# Patient Record
Sex: Female | Born: 1955 | Race: Black or African American | Hispanic: No | Marital: Married | Smoking: Never smoker
Health system: Southern US, Community
[De-identification: ages and names within clinical notes are randomized; demographics above are authoritative.]

## PROBLEM LIST (undated history)

## (undated) DIAGNOSIS — D497 Neoplasm of unspecified behavior of endocrine glands and other parts of nervous system: Secondary | ICD-10-CM

## (undated) DIAGNOSIS — E119 Type 2 diabetes mellitus without complications: Secondary | ICD-10-CM

## (undated) DIAGNOSIS — I10 Essential (primary) hypertension: Secondary | ICD-10-CM

## (undated) DIAGNOSIS — J45909 Unspecified asthma, uncomplicated: Secondary | ICD-10-CM

## (undated) DIAGNOSIS — E785 Hyperlipidemia, unspecified: Secondary | ICD-10-CM

## (undated) HISTORY — DX: Neoplasm of unspecified behavior of endocrine glands and other parts of nervous system: D49.7

## (undated) HISTORY — DX: Essential (primary) hypertension: I10

## (undated) HISTORY — DX: Hyperlipidemia, unspecified: E78.5

## (undated) HISTORY — DX: Type 2 diabetes mellitus without complications: E11.9

## (undated) HISTORY — DX: Unspecified asthma, uncomplicated: J45.909

---

## 2007-06-13 ENCOUNTER — Emergency Department (HOSPITAL_COMMUNITY): Admission: EM | Admit: 2007-06-13 | Discharge: 2007-06-13 | Payer: Self-pay | Admitting: Emergency Medicine

## 2010-03-06 ENCOUNTER — Encounter: Payer: Self-pay | Admitting: Internal Medicine

## 2012-11-27 ENCOUNTER — Encounter: Payer: Self-pay | Admitting: Podiatry

## 2012-11-27 ENCOUNTER — Ambulatory Visit (INDEPENDENT_AMBULATORY_CARE_PROVIDER_SITE_OTHER): Payer: BC Managed Care – PPO | Admitting: Podiatry

## 2012-11-27 VITALS — BP 141/89 | HR 94 | Resp 12 | Ht 66.0 in | Wt 175.0 lb

## 2012-11-27 DIAGNOSIS — M2040 Other hammer toe(s) (acquired), unspecified foot: Secondary | ICD-10-CM

## 2012-11-27 DIAGNOSIS — B351 Tinea unguium: Secondary | ICD-10-CM

## 2012-11-27 MED ORDER — TERBINAFINE HCL 250 MG PO TABS: 250.0000 mg | ORAL_TABLET | Freq: Every day | ORAL | Status: DC

## 2012-11-27 NOTE — Patient Instructions (Signed)
This will take 6 months to improve

## 2012-11-27 NOTE — Progress Notes (Signed)
N  DISCOLORATION, SORE L   RT. FOOT IST TOENAIL D  6 MONTHS  O  SLOWLY C  WORSE A   PRESSURE T   SOAK EPSON SALT

## 2012-11-27 NOTE — Progress Notes (Signed)
Subjective:     Patient ID: Gabrielle Branch, female   DOB: 1955-09-19, 57 y.o.   MRN: [id number]  HPI patient states my right big toenail is very thick and it can become painful at times. Thinks it has been present for approximately one year with no history of injury. Also has lesions on the fifth toe bilateral that can become painful   Review of Systems  All other systems reviewed and are negative.       Objective:   Physical Exam  Nursing note and vitals reviewed. Constitutional: She appears well-developed and well-nourished.  Cardiovascular: Intact distal pulses.   Musculoskeletal: Normal range of motion.  Neurological: She is alert.  Skin: Skin is warm.   Patient has a very thickened dystrophic hallux nail right with mild looseness. 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 Patient has had blood work and liver function is fine sore RN her on Lamisil 250 daily for 90 days and topical formula 3. Explained that we'll take 6 months to make a difference and that ultimately the nail may need to come off

## 2014-02-12 HISTORY — PX: BREAST BIOPSY: SHX20

## 2014-04-12 ENCOUNTER — Other Ambulatory Visit: Payer: Self-pay | Admitting: Internal Medicine

## 2014-04-12 DIAGNOSIS — N644 Mastodynia: Secondary | ICD-10-CM

## 2014-04-19 ENCOUNTER — Ambulatory Visit
Admission: RE | Admit: 2014-04-19 | Discharge: 2014-04-19 | Disposition: A | Discharge status: Home / Self Care | Payer: BLUE CROSS/BLUE SHIELD | Source: Ambulatory Visit | Attending: Internal Medicine | Admitting: Internal Medicine

## 2014-04-19 ENCOUNTER — Other Ambulatory Visit: Payer: Self-pay | Admitting: Internal Medicine

## 2014-04-19 DIAGNOSIS — N644 Mastodynia: Secondary | ICD-10-CM

## 2014-04-22 ENCOUNTER — Other Ambulatory Visit: Payer: Self-pay | Admitting: Internal Medicine

## 2014-04-22 DIAGNOSIS — N644 Mastodynia: Secondary | ICD-10-CM

## 2014-04-26 ENCOUNTER — Ambulatory Visit
Admission: RE | Admit: 2014-04-26 | Discharge: 2014-04-26 | Disposition: A | Discharge status: Home / Self Care | Payer: BLUE CROSS/BLUE SHIELD | Source: Ambulatory Visit | Attending: Internal Medicine | Admitting: Internal Medicine

## 2014-04-26 DIAGNOSIS — N644 Mastodynia: Secondary | ICD-10-CM

## 2015-02-21 ENCOUNTER — Ambulatory Visit (INDEPENDENT_AMBULATORY_CARE_PROVIDER_SITE_OTHER): Payer: 59 | Admitting: Family Medicine

## 2015-02-21 ENCOUNTER — Ambulatory Visit (INDEPENDENT_AMBULATORY_CARE_PROVIDER_SITE_OTHER): Payer: 59

## 2015-02-21 VITALS — BP 128/86 | HR 109 | Temp 97.9°F | Resp 18 | Ht 66.0 in | Wt 179.0 lb

## 2015-02-21 DIAGNOSIS — S93401A Sprain of unspecified ligament of right ankle, initial encounter: Secondary | ICD-10-CM

## 2015-02-21 DIAGNOSIS — S82401A Unspecified fracture of shaft of right fibula, initial encounter for closed fracture: Secondary | ICD-10-CM | POA: Diagnosis not present

## 2015-02-21 DIAGNOSIS — M25571 Pain in right ankle and joints of right foot: Secondary | ICD-10-CM | POA: Diagnosis not present

## 2015-02-21 NOTE — Patient Instructions (Signed)
You have a fracture (break) of your right lateral shin bone- the fibula.  We are going to make an appointment for you to see an orthopedist (special bone doctor).  We will make this arrangement and you will get a call For the time being please use your crutches to keep weight off your right foot (touching down for balance is ok) and wear your boot for protection.  Wear the boot all the time- it is ok to remove it and gently wipe your foot to clean it as needed.    Please elevate your ankle and apply ice when you can.  Use your mobic and/ or tylenol as needed for pain If you need a stronger pain medication let me know If you do not hear anything about your orthopedics appointment soon please call me  Do NOT drive with the boot on your foot!

## 2015-02-21 NOTE — Progress Notes (Signed)
[facility] [address] [place] [address] [phone]  Date:  02/21/2015   Name:  Gabrielle Branch   DOB:  22-Apr-1955   MRN:  [id number]  PCP:  Benito Mccreedy, MD    Chief Complaint: Ankle Pain   History of Present Illness:  Gabrielle Branch is a 60 y.o. very pleasant female patient who presents with the following:  Here today with a right ankle injury.  She twisted her ankle in the snow 48 hours ago- she inverted her ankle while wearing shoes that were too large for her.  She feel down onto her behind. She is otherwise unhurt.  Did not hi her head.   She is still having some pain and swelling in the right ankle.  She has tried some ice, an ace wrap.  However it still hurts if she walks on it or puts on a shoe/ sock.   She is not aware of any significant ankle issue in the past.    There are no active problems to display for this patient.   Past Medical History  Diagnosis Date  . Asthma   . Hypertension     Past Surgical History  Procedure Laterality Date  . Cesarean section      Social History  Substance Use Topics  . Smoking status: Never Smoker   . Smokeless tobacco: None  . Alcohol Use: None    Family History  Problem Relation Age of Onset  . Diabetes Mother   . Hypertension Mother     No Known Allergies  Medication list has been reviewed and updated.  Current Outpatient Prescriptions on File Prior to Visit  Medication Sig Dispense Refill  . hydrochlorothiazide (HYDRODIURIL) 25 MG tablet     . terbinafine (LAMISIL) 250 MG tablet Take 1 tablet (250 mg total) by mouth daily. 90 tablet 0   No current facility-administered medications on file prior to visit.    Review of Systems:  As per HPI- otherwise negative.   Physical Examination: Filed Vitals:   02/21/15 1101  BP: 128/86  Pulse: 109  Temp: 97.9 F (36.6 C)  Resp: 18   Filed Vitals:   02/21/15 1101  Height: 5\' 6"  (1.676 m)  Weight: 179 lb (81.194 kg)    Body mass index is 28.91 kg/(m^2). Ideal Body Weight: Weight in (lb) to have BMI = 25: 154.6  GEN: WDWN, NAD, Non-toxic, A & O x 3 HEENT: Atraumatic, Normocephalic. Neck supple. No masses, No LAD. Ears and Nose: No external deformity. CV: RRR, No M/G/R. No JVD. No thrill. No extra heart sounds. PULM: CTA B, no wheezes, crackles, rhonchi. No retractions. No resp. distress. No accessory muscle use. ABD: S, NT, ND, +BS. No rebound. No HSM. EXTR: No c/c/e NEURO favoring right foot PSYCH: Normally interactive. Conversant. Not depressed or anxious appearing.  Calm demeanor.  Right ankle: swelling and bruising more over the lateral and anterior ankle.  Ankle ROM is restricted due to pain.  Foot NV intact  Achilles is intact No 5th MT tenderness   [facility] reading (PRIMARY) by  Dr. Lorelei Pont. Right ankle: minimally displaced fracture of distal fibula Right foot: negative  RIGHT FOOT COMPLETE - 3+ VIEW  COMPARISON: None.  FINDINGS: There is no evidence of fracture or dislocation. There is no evidence of arthropathy or other focal bone abnormality. Soft tissues are unremarkable.  IMPRESSION: Negative.  RIGHT ANKLE - COMPLETE 3+ VIEW  COMPARISON: None.  FINDINGS: Spiral but minimally displaced fracture of  the distal right fibula meta diaphysis, beginning about 3 cm proximal to the tibial plafond. Slight lateral subluxation of the mortise joint. No ankle joint effusion. Talar dome and distal tibia appear intact. Calcaneus intact. No other acute fracture identified.  IMPRESSION: Spiral, minimally displaced fracture of the distal right fibula beginning about 3 cm proximal to the tibial plafond. Slight lateral subluxation of the mortise joint which otherwise appears intact.   Assessment and Plan: Right fibular fracture, closed, initial encounter - Plan: Ambulatory referral to Orthopedic Surgery  Right ankle pain  Right ankle sprain, initial encounter - Plan: DG Ankle Complete  Right, DG Foot Complete Right  Placed in a tall CAM walker boot and given crutches. She declines pain medication. Will refer for ortho follow-up this week      Signed Lamar Blinks, MD

## 2015-04-18 ENCOUNTER — Other Ambulatory Visit: Payer: Self-pay

## 2015-04-18 DIAGNOSIS — Z1231 Encounter for screening mammogram for malignant neoplasm of breast: Secondary | ICD-10-CM

## 2015-05-03 ENCOUNTER — Ambulatory Visit: Payer: BLUE CROSS/BLUE SHIELD

## 2015-05-05 ENCOUNTER — Ambulatory Visit
Admission: RE | Admit: 2015-05-05 | Discharge: 2015-05-05 | Disposition: A | Discharge status: Home / Self Care | Payer: 59 | Source: Ambulatory Visit

## 2015-05-05 DIAGNOSIS — Z1231 Encounter for screening mammogram for malignant neoplasm of breast: Secondary | ICD-10-CM

## 2016-03-28 DIAGNOSIS — R1084 Generalized abdominal pain: Secondary | ICD-10-CM | POA: Diagnosis not present

## 2016-03-28 DIAGNOSIS — K219 Gastro-esophageal reflux disease without esophagitis: Secondary | ICD-10-CM | POA: Diagnosis not present

## 2016-03-28 DIAGNOSIS — I10 Essential (primary) hypertension: Secondary | ICD-10-CM | POA: Diagnosis not present

## 2016-03-28 DIAGNOSIS — M25511 Pain in right shoulder: Secondary | ICD-10-CM | POA: Diagnosis not present

## 2016-03-28 DIAGNOSIS — J069 Acute upper respiratory infection, unspecified: Secondary | ICD-10-CM | POA: Diagnosis not present

## 2016-06-04 DIAGNOSIS — Z01118 Encounter for examination of ears and hearing with other abnormal findings: Secondary | ICD-10-CM | POA: Diagnosis not present

## 2016-06-04 DIAGNOSIS — Z136 Encounter for screening for cardiovascular disorders: Secondary | ICD-10-CM | POA: Diagnosis not present

## 2016-06-04 DIAGNOSIS — Z0000 Encounter for general adult medical examination without abnormal findings: Secondary | ICD-10-CM | POA: Diagnosis not present

## 2016-06-04 DIAGNOSIS — Z131 Encounter for screening for diabetes mellitus: Secondary | ICD-10-CM | POA: Diagnosis not present

## 2016-06-04 DIAGNOSIS — H538 Other visual disturbances: Secondary | ICD-10-CM | POA: Diagnosis not present

## 2016-06-11 ENCOUNTER — Other Ambulatory Visit: Payer: Self-pay | Admitting: Internal Medicine

## 2016-06-11 DIAGNOSIS — Z1231 Encounter for screening mammogram for malignant neoplasm of breast: Secondary | ICD-10-CM

## 2016-06-27 ENCOUNTER — Ambulatory Visit
Admission: RE | Admit: 2016-06-27 | Discharge: 2016-06-27 | Disposition: A | Discharge status: Home / Self Care | Payer: 59 | Source: Ambulatory Visit | Attending: Internal Medicine | Admitting: Internal Medicine

## 2016-06-27 DIAGNOSIS — Z1231 Encounter for screening mammogram for malignant neoplasm of breast: Secondary | ICD-10-CM

## 2016-09-03 DIAGNOSIS — I10 Essential (primary) hypertension: Secondary | ICD-10-CM | POA: Diagnosis not present

## 2016-09-03 DIAGNOSIS — E785 Hyperlipidemia, unspecified: Secondary | ICD-10-CM | POA: Diagnosis not present

## 2016-09-03 DIAGNOSIS — K219 Gastro-esophageal reflux disease without esophagitis: Secondary | ICD-10-CM | POA: Diagnosis not present

## 2016-12-06 DIAGNOSIS — K219 Gastro-esophageal reflux disease without esophagitis: Secondary | ICD-10-CM | POA: Diagnosis not present

## 2016-12-06 DIAGNOSIS — I10 Essential (primary) hypertension: Secondary | ICD-10-CM | POA: Diagnosis not present

## 2016-12-06 DIAGNOSIS — R062 Wheezing: Secondary | ICD-10-CM | POA: Diagnosis not present

## 2016-12-06 DIAGNOSIS — E785 Hyperlipidemia, unspecified: Secondary | ICD-10-CM | POA: Diagnosis not present

## 2017-03-08 DIAGNOSIS — E785 Hyperlipidemia, unspecified: Secondary | ICD-10-CM | POA: Diagnosis not present

## 2017-03-08 DIAGNOSIS — R141 Gas pain: Secondary | ICD-10-CM | POA: Diagnosis not present

## 2017-03-08 DIAGNOSIS — K219 Gastro-esophageal reflux disease without esophagitis: Secondary | ICD-10-CM | POA: Diagnosis not present

## 2017-03-08 DIAGNOSIS — I10 Essential (primary) hypertension: Secondary | ICD-10-CM | POA: Diagnosis not present

## 2017-06-10 DIAGNOSIS — Z136 Encounter for screening for cardiovascular disorders: Secondary | ICD-10-CM | POA: Diagnosis not present

## 2017-06-10 DIAGNOSIS — E785 Hyperlipidemia, unspecified: Secondary | ICD-10-CM | POA: Diagnosis not present

## 2017-06-10 DIAGNOSIS — I10 Essential (primary) hypertension: Secondary | ICD-10-CM | POA: Diagnosis not present

## 2017-06-10 DIAGNOSIS — K219 Gastro-esophageal reflux disease without esophagitis: Secondary | ICD-10-CM | POA: Diagnosis not present

## 2017-06-10 DIAGNOSIS — Z0110 Encounter for examination of ears and hearing without abnormal findings: Secondary | ICD-10-CM | POA: Diagnosis not present

## 2017-06-10 DIAGNOSIS — Z0000 Encounter for general adult medical examination without abnormal findings: Secondary | ICD-10-CM | POA: Diagnosis not present

## 2017-08-09 DIAGNOSIS — M545 Low back pain: Secondary | ICD-10-CM | POA: Diagnosis not present

## 2017-08-09 DIAGNOSIS — E785 Hyperlipidemia, unspecified: Secondary | ICD-10-CM | POA: Diagnosis not present

## 2017-08-09 DIAGNOSIS — I10 Essential (primary) hypertension: Secondary | ICD-10-CM | POA: Diagnosis not present

## 2017-08-09 DIAGNOSIS — K219 Gastro-esophageal reflux disease without esophagitis: Secondary | ICD-10-CM | POA: Diagnosis not present

## 2017-08-23 DIAGNOSIS — I10 Essential (primary) hypertension: Secondary | ICD-10-CM | POA: Diagnosis not present

## 2017-08-23 DIAGNOSIS — R0602 Shortness of breath: Secondary | ICD-10-CM | POA: Diagnosis not present

## 2017-09-20 DIAGNOSIS — K219 Gastro-esophageal reflux disease without esophagitis: Secondary | ICD-10-CM | POA: Diagnosis not present

## 2017-09-20 DIAGNOSIS — E785 Hyperlipidemia, unspecified: Secondary | ICD-10-CM | POA: Diagnosis not present

## 2017-09-20 DIAGNOSIS — I10 Essential (primary) hypertension: Secondary | ICD-10-CM | POA: Diagnosis not present

## 2017-11-25 DIAGNOSIS — Z23 Encounter for immunization: Secondary | ICD-10-CM | POA: Diagnosis not present

## 2017-11-25 DIAGNOSIS — M25512 Pain in left shoulder: Secondary | ICD-10-CM | POA: Diagnosis not present

## 2017-11-25 DIAGNOSIS — E785 Hyperlipidemia, unspecified: Secondary | ICD-10-CM | POA: Diagnosis not present

## 2017-11-25 DIAGNOSIS — I10 Essential (primary) hypertension: Secondary | ICD-10-CM | POA: Diagnosis not present

## 2018-02-14 DIAGNOSIS — E785 Hyperlipidemia, unspecified: Secondary | ICD-10-CM | POA: Diagnosis not present

## 2018-02-14 DIAGNOSIS — K219 Gastro-esophageal reflux disease without esophagitis: Secondary | ICD-10-CM | POA: Diagnosis not present

## 2018-02-14 DIAGNOSIS — M545 Low back pain: Secondary | ICD-10-CM | POA: Diagnosis not present

## 2018-02-14 DIAGNOSIS — I10 Essential (primary) hypertension: Secondary | ICD-10-CM | POA: Diagnosis not present

## 2018-03-06 DIAGNOSIS — B070 Plantar wart: Secondary | ICD-10-CM | POA: Diagnosis not present

## 2018-03-06 DIAGNOSIS — S90852A Superficial foreign body, left foot, initial encounter: Secondary | ICD-10-CM | POA: Diagnosis not present

## 2018-06-27 DIAGNOSIS — Z0000 Encounter for general adult medical examination without abnormal findings: Secondary | ICD-10-CM | POA: Diagnosis not present

## 2019-01-19 ENCOUNTER — Other Ambulatory Visit: Payer: Self-pay | Admitting: Internal Medicine

## 2019-01-19 DIAGNOSIS — Z1231 Encounter for screening mammogram for malignant neoplasm of breast: Secondary | ICD-10-CM

## 2019-02-12 ENCOUNTER — Other Ambulatory Visit: Payer: Self-pay

## 2019-02-12 ENCOUNTER — Ambulatory Visit
Admission: RE | Admit: 2019-02-12 | Discharge: 2019-02-12 | Disposition: A | Discharge status: Home / Self Care | Payer: 59 | Source: Ambulatory Visit | Attending: Internal Medicine | Admitting: Internal Medicine

## 2019-02-12 DIAGNOSIS — Z1231 Encounter for screening mammogram for malignant neoplasm of breast: Secondary | ICD-10-CM

## 2021-07-22 IMAGING — MG DIGITAL SCREENING BILAT W/ CAD
4 series · 4 of 4 positions shown · non-contrast
Comparison: Previous exam(s).

CLINICAL DATA: Screening.

EXAM:
DIGITAL SCREENING BILATERAL MAMMOGRAM WITH CAD

[L CC]
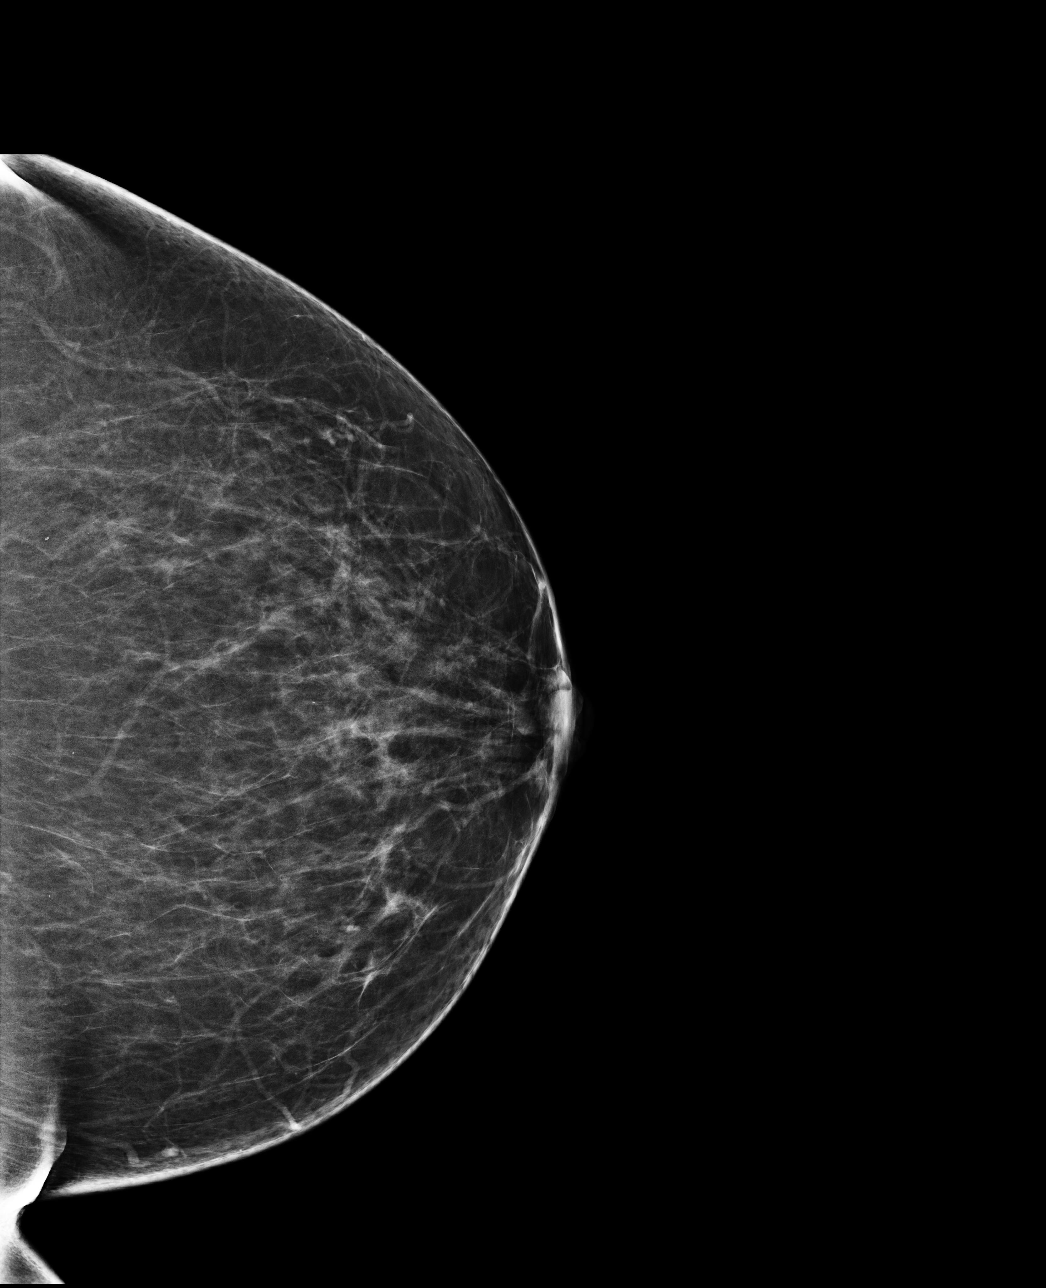

[L MLO]
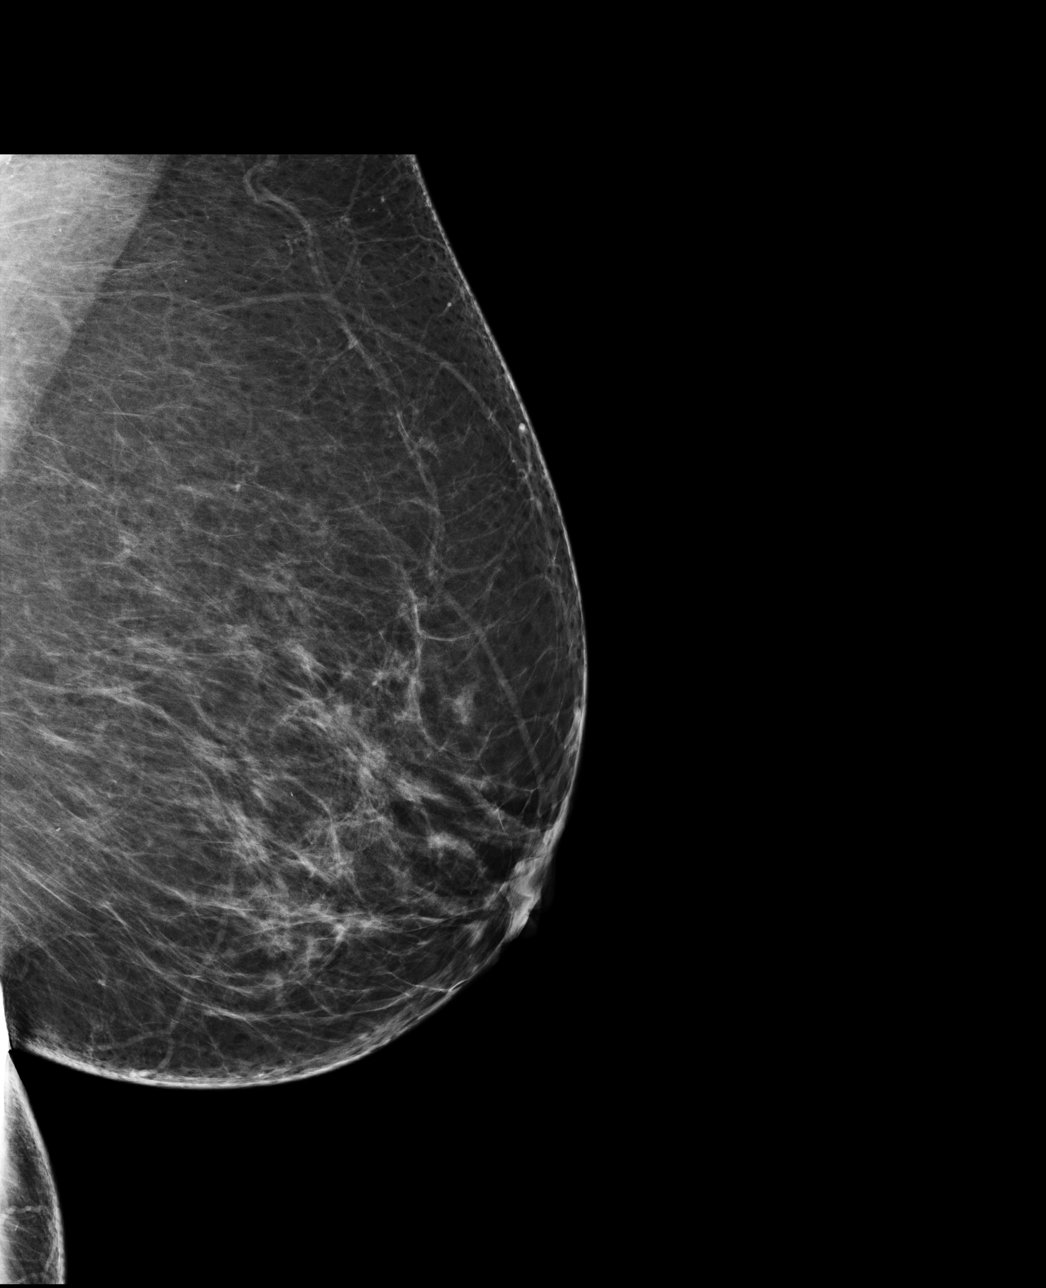

[R MLO]
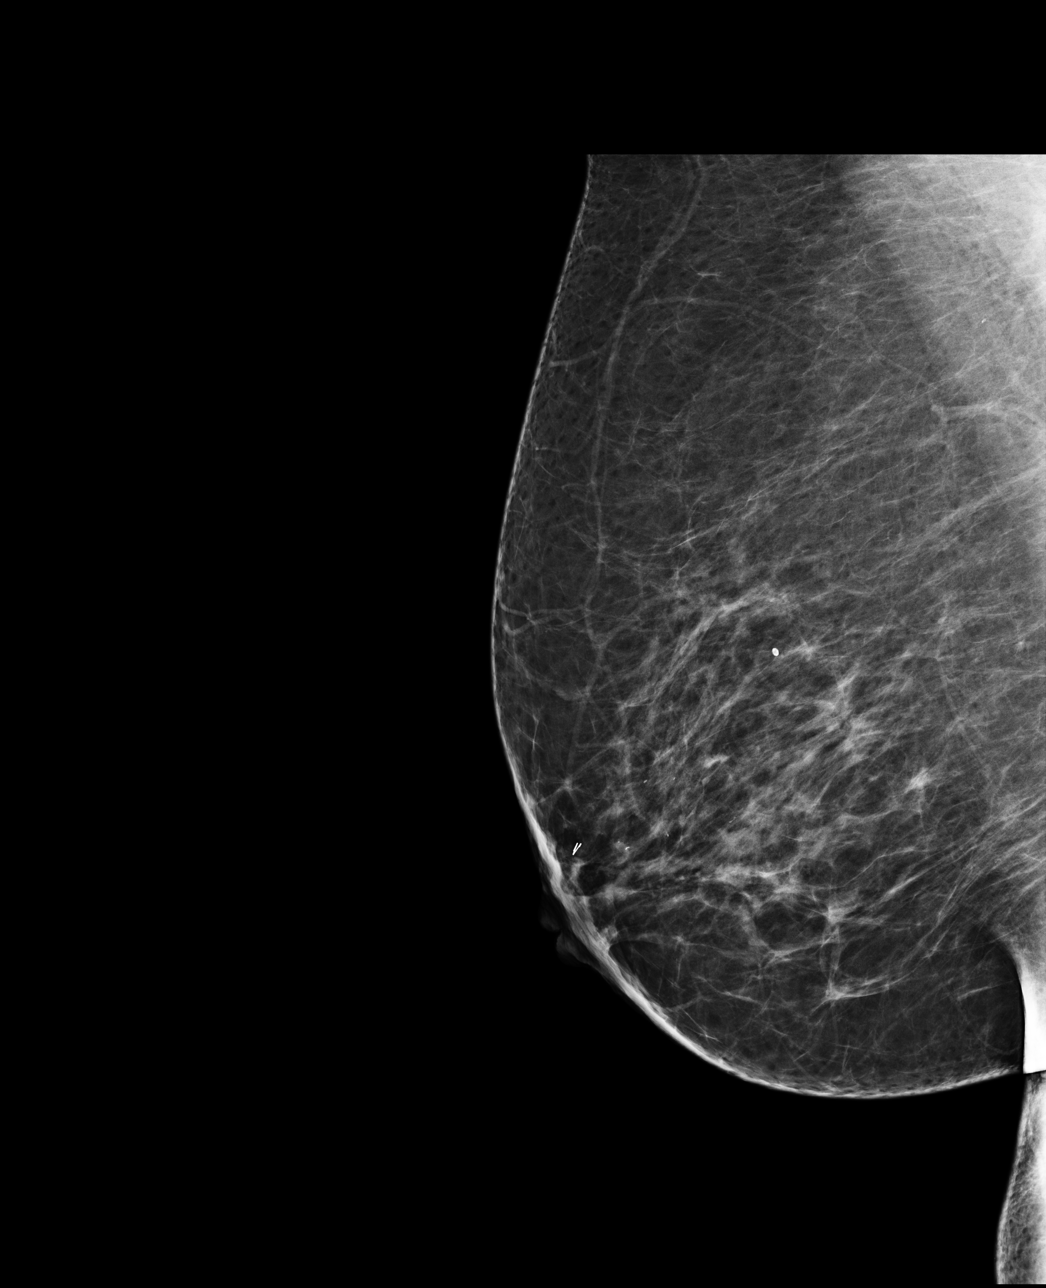

[R CC]
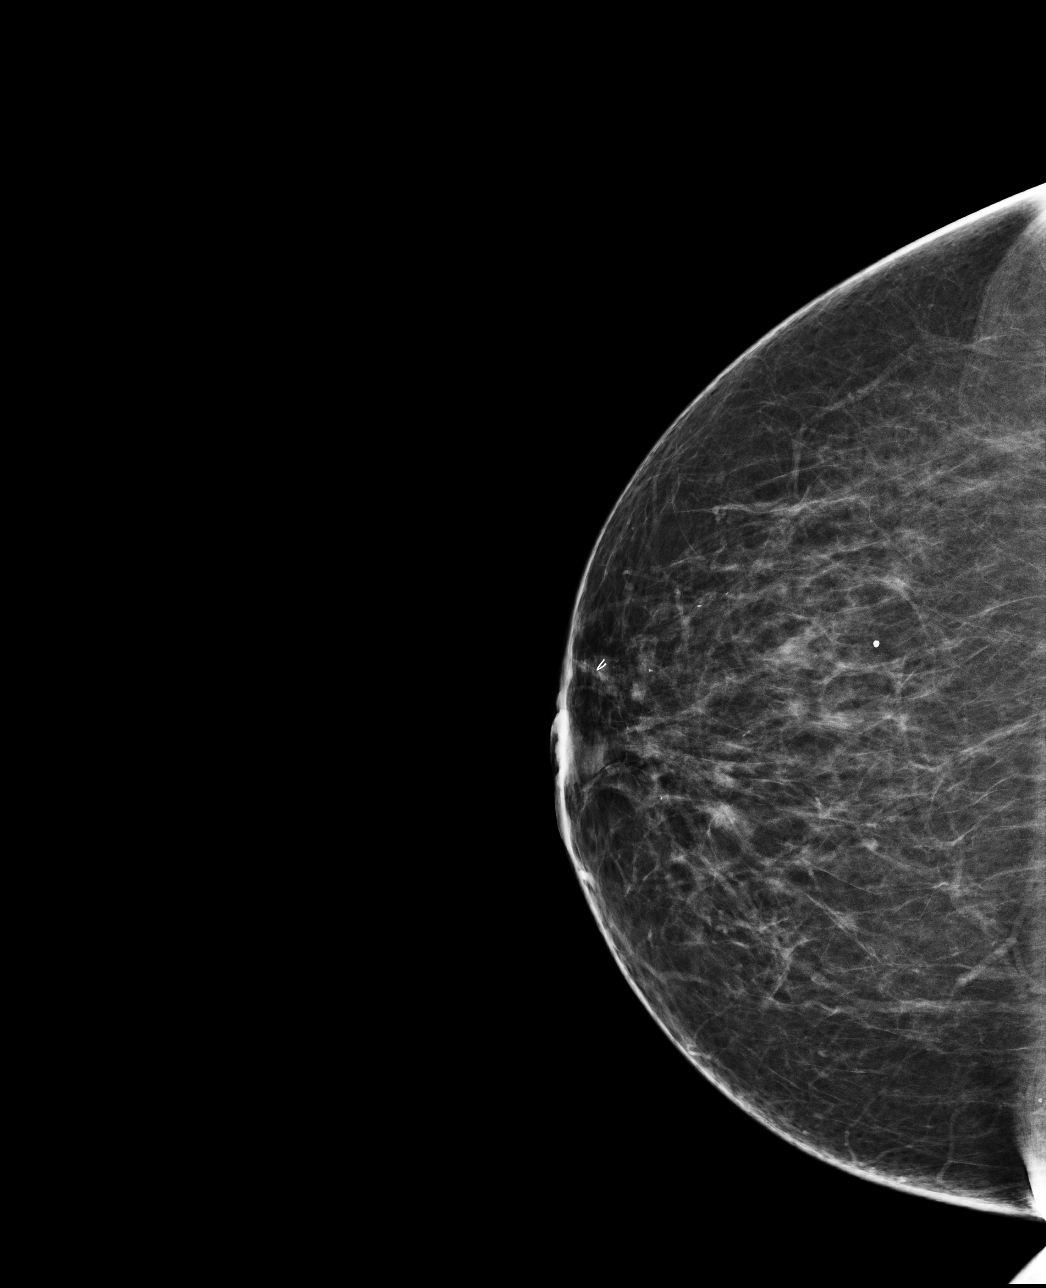

[4 of 4 positions shown; findings below may reference images not displayed]

ACR Breast Density Category b: There are scattered areas of
fibroglandular density.
FINDINGS: There are no findings suspicious for malignancy. Images were
processed with CAD.
IMPRESSION: No mammographic evidence of malignancy. A result letter of this
screening mammogram will be mailed directly to the patient.

RECOMMENDATION:
Screening mammogram in one year. (Code:AS-G-LCT)

BI-RADS CATEGORY  1: Negative.

## 2021-11-13 ENCOUNTER — Other Ambulatory Visit (HOSPITAL_COMMUNITY)
Admission: RE | Admit: 2021-11-13 | Discharge: 2021-11-13 | Disposition: A | Discharge status: Home / Self Care | Payer: Medicare Other | Source: Ambulatory Visit | Attending: Family Medicine | Admitting: Family Medicine

## 2021-11-13 ENCOUNTER — Other Ambulatory Visit: Payer: Self-pay

## 2021-11-13 ENCOUNTER — Ambulatory Visit (INDEPENDENT_AMBULATORY_CARE_PROVIDER_SITE_OTHER): Payer: Medicare Other | Admitting: Family Medicine

## 2021-11-13 ENCOUNTER — Encounter: Payer: Self-pay | Admitting: Family Medicine

## 2021-11-13 VITALS — BP 135/84 | HR 100 | Ht 66.0 in | Wt 186.0 lb

## 2021-11-13 DIAGNOSIS — D352 Benign neoplasm of pituitary gland: Secondary | ICD-10-CM

## 2021-11-13 DIAGNOSIS — Z1231 Encounter for screening mammogram for malignant neoplasm of breast: Secondary | ICD-10-CM | POA: Diagnosis not present

## 2021-11-13 DIAGNOSIS — N898 Other specified noninflammatory disorders of vagina: Secondary | ICD-10-CM

## 2021-11-13 DIAGNOSIS — Z1151 Encounter for screening for human papillomavirus (HPV): Secondary | ICD-10-CM | POA: Insufficient documentation

## 2021-11-13 DIAGNOSIS — Z124 Encounter for screening for malignant neoplasm of cervix: Secondary | ICD-10-CM | POA: Diagnosis present

## 2021-11-13 DIAGNOSIS — F40240 Claustrophobia: Secondary | ICD-10-CM

## 2021-11-13 DIAGNOSIS — Z01419 Encounter for gynecological examination (general) (routine) without abnormal findings: Secondary | ICD-10-CM | POA: Diagnosis not present

## 2021-11-13 MED ORDER — LORAZEPAM 1 MG PO TABS: 1.0000 mg | ORAL_TABLET | Freq: Once | ORAL | 0 refills | Status: DC | PRN

## 2021-11-13 NOTE — Progress Notes (Signed)
GYNECOLOGY OFFICE VISIT NOTE  History:   Gabrielle Branch is a 66 y.o. G2P0002 here today for NEW GYN appt. she notes that her last Pap smear was approximately 4-5 years ago.  She has never had abnormal Paps.  Her last mammogram was in 02/12/2019 and it was noted to be normal.  S/p ultrasound-guided core needle biopsy of the 8 mm mass in the 1 o'clock retroareolar right breast in 2016.  She also has a history of a pituitary tumor, a prolactinoma per patient diagnosed approximately 30 years ago.  At the time she was attempting to conceive and she was placed on "some medication" and she was able to become pregnant.  She notes she has not gone through any more treatment since then, and she still notes galactorrhea when she expresses her breasts.  She does admit to abnormal vaginal discharge, yellow.  She attributes this to B complex vitamins that she has been taking recently.  She does have some pelvic cramping every once in a while, but no vaginal itching, urination burning, constant pelvic pain.  She denies any abnormal vaginal bleeding.    Past Medical History:  Diagnosis Date   Asthma    Hypertension     Past Surgical History:  Procedure Laterality Date   BREAST BIOPSY Right 2016   Benign [place]   CESAREAN SECTION      The following portions of the patient's history were reviewed and updated as appropriate: allergies, current medications, past family history, past medical history, past social history, past surgical history and problem list.   Health Maintenance:  Normal pap and negative HRHPV on 4-5 years ago.  Normal mammogram on 2010.   Review of Systems:  Pertinent items noted in HPI and remainder of comprehensive ROS otherwise negative.  Physical Exam:  BP 135/84   Pulse 100   Ht '5\' 6"'$  (1.676 m)   Wt 186 lb (84.4 kg)   BMI 30.02 kg/m  CONSTITUTIONAL: Well-developed, well-nourished female in no acute distress.  HEENT:  Normocephalic, atraumatic. External right and left ear  normal. No scleral icterus.  NECK: Normal range of motion, supple, no masses noted on observation SKIN: No rash noted. Not diaphoretic. No erythema. No pallor. MUSCULOSKELETAL: Normal range of motion. No edema noted. NEUROLOGIC: Alert and oriented to person, place, and time. Normal muscle tone coordination.  PSYCHIATRIC: Normal mood and affect. Normal behavior. Normal judgment and thought content. CARDIOVASCULAR: Normal heart rate noted RESPIRATORY: Effort and breath sounds normal, no problems with respiration noted ABDOMEN: No masses noted. No other overt distention noted.   PELVIC: Normal appearing external genitalia; normal urethral meatus; normal appearing vaginal mucosa. One polyp at 5 o'clock position at internal os of cervix. Also cervix is friable upon manipulation.  No abnormal discharge noted.  Normal uterine size, no other palpable masses, no uterine or adnexal tenderness. Performed in the presence of a chaperone  Labs and Imaging No results found for this or any previous visit (from the past 168 hour(s)). No results found.    Assessment and Plan:      1. Well woman exam with routine gynecological exam  2. Screening for malignant neoplasm of cervix - Cytology - PAP( [place])  3. 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  Dispense: 1 tablet; Refill: 0  5. Vaginal discharge - Cervicovaginal ancillary only( [place])  6. Claustrophobia - LORazepam (ATIVAN) 1 MG tablet; Take 1 tablet (1 mg total) by mouth Once PRN for up to 1 dose for anxiety (for MRI).  Dispense: 1 tablet; Refill: 0   Patient seen for new Gyn appointment with several other concerns.  Pap smear completed today and noted cervical polyp. Also noted copious vaginal discharge,  thick and yellowish.  Patient notes she been taking B12 vitamins, however I do not think this is causing her diffuse discharge.  Also noted a friable cervix upon Pap smear pressure application.  Obtained cervical axillary swab in addition to Pap smear.  If her copious discharge is not explained by infection, left patient follow-up for surgical follow-up evaluation and potential removal.  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  She still is able to express "normal," from her breasts. Unknown last MRI and unclear what the size of the lesion is. Ordered MRI brain with ativan for anxiety related to small/ enclosed spaces. Mammogram also ordered. Normal breast exam today.   Routine preventative health maintenance measures emphasized. Please refer to After Visit Summary for other counseling recommendations.   Return if symptoms worsen or fail to improve.     Shelda Pal, DO OB Fellow, [place] for [place] 11/13/2021 10:13 AM

## 2021-11-14 LAB — CERVICOVAGINAL ANCILLARY ONLY
Bacterial Vaginitis (gardnerella): NEGATIVE
Candida Glabrata: NEGATIVE
Candida Vaginitis: NEGATIVE
Chlamydia: NEGATIVE
Comment: NEGATIVE
Comment: NEGATIVE
Comment: NEGATIVE
Comment: NEGATIVE
Comment: NEGATIVE
Comment: NORMAL
Neisseria Gonorrhea: NEGATIVE
Trichomonas: NEGATIVE

## 2021-11-15 LAB — CYTOLOGY - PAP
Comment: NEGATIVE
Diagnosis: NEGATIVE
High risk HPV: NEGATIVE

## 2021-11-21 ENCOUNTER — Ambulatory Visit (HOSPITAL_COMMUNITY)
Admission: RE | Admit: 2021-11-21 | Discharge: 2021-11-21 | Disposition: A | Discharge status: Home / Self Care | Payer: Medicare Other | Source: Ambulatory Visit | Attending: Family Medicine | Admitting: Family Medicine

## 2021-11-21 DIAGNOSIS — D352 Benign neoplasm of pituitary gland: Secondary | ICD-10-CM | POA: Diagnosis present

## 2021-11-21 MED ORDER — GADOBUTROL 1 MMOL/ML IV SOLN
8.0000 mL | Freq: Once | INTRAVENOUS | Status: AC | PRN
  Administered 2021-11-21: 8 mL via INTRAVENOUS

## 2021-11-28 ENCOUNTER — Ambulatory Visit
Admission: RE | Admit: 2021-11-28 | Discharge: 2021-11-28 | Disposition: A | Discharge status: Home / Self Care | Payer: Medicare Other | Source: Ambulatory Visit | Attending: Family Medicine | Admitting: Family Medicine

## 2021-11-28 DIAGNOSIS — Z1231 Encounter for screening mammogram for malignant neoplasm of breast: Secondary | ICD-10-CM

## 2021-11-29 ENCOUNTER — Ambulatory Visit: Payer: PRIVATE HEALTH INSURANCE | Admitting: Obstetrics and Gynecology

## 2021-12-25 ENCOUNTER — Encounter: Payer: Self-pay | Admitting: Family Medicine

## 2021-12-25 ENCOUNTER — Ambulatory Visit (INDEPENDENT_AMBULATORY_CARE_PROVIDER_SITE_OTHER): Payer: Medicare Other | Admitting: Family Medicine

## 2021-12-25 VITALS — BP 155/89 | HR 91 | Ht 66.0 in | Wt 184.1 lb

## 2021-12-25 DIAGNOSIS — N841 Polyp of cervix uteri: Secondary | ICD-10-CM | POA: Diagnosis not present

## 2021-12-25 DIAGNOSIS — N898 Other specified noninflammatory disorders of vagina: Secondary | ICD-10-CM

## 2021-12-25 DIAGNOSIS — N939 Abnormal uterine and vaginal bleeding, unspecified: Secondary | ICD-10-CM | POA: Diagnosis not present

## 2021-12-25 NOTE — Progress Notes (Signed)
   GYNECOLOGY OFFICE VISIT NOTE  History:   Gabrielle Branch is a 66 y.o. G2P0002 here today for Cervical polyp follow up. She has a h/o endometrial polyps in her 77s, underwent surgery to remove them in [place].  She still has copious discharge since last visit.  Her STI work-up was negative.  Her Pap smear was normal.  She denies any abnormal vaginal bleeding, pelvic pain or other concerns.    Past Medical History:  Diagnosis Date   Asthma    Hypertension     Past Surgical History:  Procedure Laterality Date   BREAST BIOPSY Right 2016   Benign [place]   CESAREAN SECTION      The following portions of the patient's history were reviewed and updated as appropriate: allergies, current medications, past family history, past medical history, past social history, past surgical history and problem list.   Health Maintenance:  Normal pap and negative HRHPV on 2023.  Normal mammogram on 2023.   Review of Systems:  Pertinent items noted in HPI and remainder of comprehensive ROS otherwise negative.  Physical Exam:  BP (!) 155/89   Pulse 91   Ht '5\' 6"'$  (1.676 m)   Wt 184 lb 1.6 oz (83.5 kg)   BMI 29.71 kg/m  CONSTITUTIONAL: Well-developed, well-nourished female in no acute distress.  HEENT:  Normocephalic, atraumatic. External right and left ear normal. No scleral icterus.  NECK: Normal range of motion, supple, no masses noted on observation SKIN: No rash noted. Not diaphoretic. No erythema. No pallor. MUSCULOSKELETAL: Normal range of motion. No edema noted. NEUROLOGIC: Alert and oriented to person, place, and time. Normal muscle tone coordination.  PSYCHIATRIC: Normal mood and affect. Normal behavior. Normal judgment and thought content. CARDIOVASCULAR: Normal heart rate noted RESPIRATORY: Effort and breath sounds normal, no problems with respiration noted ABDOMEN: No masses noted. No other overt distention noted.   PELVIC: Deferred  Labs and Imaging No results found for this or any  previous visit (from the past 168 hour(s)). MM 3D SCREEN BREAST BILATERAL  Result Date: 11/29/2021 CLINICAL DATA:  Screening. EXAM: DIGITAL SCREENING BILATERAL MAMMOGRAM WITH TOMOSYNTHESIS AND CAD TECHNIQUE: Bilateral screening digital craniocaudal and mediolateral oblique mammograms were obtained. Bilateral screening digital breast tomosynthesis was performed. The images were evaluated with computer-aided detection. COMPARISON:  Previous exam(s). ACR Breast Density Category c: The breast tissue is heterogeneously dense, which may obscure small masses. FINDINGS: There are no findings suspicious for malignancy. IMPRESSION: No mammographic evidence of malignancy. A result letter of this screening mammogram will be mailed directly to the patient. RECOMMENDATION: Screening mammogram in one year. (Code:SM-B-01Y) BI-RADS CATEGORY  1: Negative. Electronically Signed   By: Lovey Newcomer M.D.   On: 11/29/2021 06:01      Assessment and Plan:      1. Vaginal discharge - US Transvaginal Non-OB; Future  2. Cervical polyp - US Transvaginal Non-OB; Future Patient seen for follow-up of cervical polyp.  She had symptomatology that would recommend removal.  She does need an ultrasound to further evaluate origin of polyp.  This has been ordered today.  Routine preventative health maintenance measures emphasized. Please refer to After Visit Summary for other counseling recommendations.   Return in about 2 weeks (around 01/08/2022) for Procedure: polyp removal.     Shelda Pal, DO OB Fellow, [place] for [place] 12/25/2021 4:51 PM

## 2022-01-01 ENCOUNTER — Ambulatory Visit: Payer: Medicare Other

## 2022-01-02 ENCOUNTER — Ambulatory Visit
Admission: RE | Admit: 2022-01-02 | Discharge: 2022-01-02 | Disposition: A | Discharge status: Home / Self Care | Payer: Medicare Other | Source: Ambulatory Visit | Attending: Family Medicine | Admitting: Family Medicine

## 2022-01-02 DIAGNOSIS — N939 Abnormal uterine and vaginal bleeding, unspecified: Secondary | ICD-10-CM | POA: Insufficient documentation

## 2022-01-02 DIAGNOSIS — N898 Other specified noninflammatory disorders of vagina: Secondary | ICD-10-CM | POA: Insufficient documentation

## 2022-01-02 DIAGNOSIS — N841 Polyp of cervix uteri: Secondary | ICD-10-CM | POA: Diagnosis present

## 2022-01-11 ENCOUNTER — Ambulatory Visit (INDEPENDENT_AMBULATORY_CARE_PROVIDER_SITE_OTHER): Payer: Medicare Other | Admitting: Obstetrics and Gynecology

## 2022-01-11 ENCOUNTER — Encounter: Payer: Self-pay | Admitting: Obstetrics and Gynecology

## 2022-01-11 VITALS — BP 171/85 | HR 104 | Wt 183.6 lb

## 2022-01-11 DIAGNOSIS — N841 Polyp of cervix uteri: Secondary | ICD-10-CM

## 2022-01-11 DIAGNOSIS — N898 Other specified noninflammatory disorders of vagina: Secondary | ICD-10-CM

## 2022-01-11 NOTE — Progress Notes (Signed)
GYNECOLOGY VISIT  Patient name: Gabrielle Branch MRN [id number]  Date of birth: 1955-11-23 Chief Complaint:   Procedure  History:  Gabrielle Branch is a 66 y.o. G2P0002 being seen today for cervical polyp.  Has been having continued vaginal discharge. No vaginal bleeding but intermittent brown discharge - very small amount. When near a bathroom has urinary urgency with small leak. Also has loss of urine with sneeze/laugh/cough. Not sexually active. History of heavy periods prior to menopause ~10 years ago. History of polyp when in her 36s.   Past Medical History:  Diagnosis Date   Asthma    Hypertension     Past Surgical History:  Procedure Laterality Date   BREAST BIOPSY Right 2016   Benign [place]   CESAREAN SECTION      The following portions of the patient's history were reviewed and updated as appropriate: allergies, current medications, past family history, past medical history, past social history, past surgical history and problem list.   Review of Systems:  Pertinent items are noted in HPI. Comprehensive review of systems was otherwise negative.   Objective:  Physical Exam BP (!) 171/85   Pulse (!) 104   Wt 183 lb 9.6 oz (83.3 kg)   BMI 29.63 kg/m    Physical Exam Vitals and nursing note reviewed. Exam conducted with a chaperone present.  Constitutional:      Appearance: Normal appearance.  HENT:     Head: Normocephalic and atraumatic.  Pulmonary:     Effort: Pulmonary effort is normal.  Genitourinary:    General: Normal vulva.     Comments: Able to visualize upper half cervical os and upper half of cervix, unable to fully visualize full cervix due to significant patient discomfort  Skin:    General: Skin is warm and dry.  Neurological:     General: No focal deficit present.     Mental Status: She is alert.  Psychiatric:        Mood and Affect: Mood normal.        Behavior: Behavior normal.        Thought Content: Thought content normal.         Judgment: Judgment normal.      Labs and Imaging US PELVIC COMPLETE WITH TRANSVAGINAL  Result Date: 01/04/2022 CLINICAL DATA:  cervical polyp, copious discharge, bleeding EXAM: TRANSABDOMINAL ULTRASOUND OF PELVIS TECHNIQUE: Transabdominal and transvaginalultrasound examination of the pelvis was performed including evaluation of the uterus, ovaries, adnexal regions, and pelvic cul-de-sac. COMPARISON:  None Available. FINDINGS: Uterusretroverted, 7 x 5 x 3 cm. The endometrium 4 mm. The uterine cavity is empty. Myometrium is diffusely heterogeneous which can be seen with adenomyosis. This finding can be assessed further with pelvic MRI if indicated. No focal myometrial lesions identified. Right ovary Dominant simple follicular ovarian cysts, 2.2 cm. Right ovary measured 3.3 x 3.1 x 2.0 cm. Left ovary Unremarkable, 1.4 x 1.3 x 1.0 cm. Images of the adnexae demonstrated no masses or fluid collections. Ovaries demonstrate blood flow with Doppler. IMPRESSION: Heterogeneous retroverted uterus with possible adenomyosis. Right ovarian simple appearing 2.2 cm follicular cyst. Electronically Signed   By: Sammie Bench M.D.   On: 01/04/2022 16:39       Assessment & Plan:   1. Vaginal discharge Prior pap negative, vaginitis swab negative.   2. Cervical polyp Unable to visualized cervix. Pelvic US within normal limits. Unable to see cervix due to pain - will return and use lidocaine jelly and smaller speculum. If  polyp visualized, will attempt removal. Patient does not want to have to go to the OR.     Routine preventative health maintenance measures emphasized.  Darliss Cheney, MD [facility] [place]

## 2022-01-22 ENCOUNTER — Encounter: Payer: Self-pay | Admitting: *Deleted

## 2022-02-20 NOTE — Progress Notes (Deleted)
    GYNECOLOGY VISIT  Patient name: Gabrielle Branch MRN [id number]  Date of birth: [birth year] Chief Complaint:   No chief complaint on file.   History:  Gabrielle Branch is a 67 y.o. G2P0002 being seen today for ***.    Past Medical History:  Diagnosis Date   Asthma    Hypertension     Past Surgical History:  Procedure Laterality Date   BREAST BIOPSY Right 2016   Benign [place]   CESAREAN SECTION      The following portions of the patient's history were reviewed and updated as appropriate: allergies, current medications, past family history, past medical history, past social history, past surgical history and problem list.   Health Maintenance:  H/O abnormal pap: {yes/yes***/no:23866}   Review of Systems:  {Ros - complete:30496} Comprehensive review of systems was otherwise negative.   Objective:  Physical Exam There were no vitals taken for this visit.   Physical Exam   Labs and Imaging No results found.     Assessment & Plan:   There are no diagnoses linked to this encounter.   *** Routine preventative health maintenance measures emphasized.  Darliss Cheney, MD [facility] for [place]

## 2022-02-21 ENCOUNTER — Ambulatory Visit: Payer: Medicare Other | Admitting: Obstetrics and Gynecology

## 2023-06-11 NOTE — Progress Notes (Unsigned)
 Cardiology Office Note   Date:  06/12/2023   ID:  Gabrielle Branch, DOB [birth year], MRN [id number]  PCP:  Tretha Fu, MD  Cardiologist:   Eilleen Grates, MD Referring:  Tretha Fu, MD  No chief complaint on file.     History of Present Illness: Gabrielle Branch is a 68 y.o. female who presents for evaluation of an abnormal EKG.  She has no past cardiac history although she seems to describe a previous echocardiogram in the distant past.  She has had some skipped heartbeats that occasionally she notices but they do not cause her to have presyncope or syncope.  She describes some isolated palpitations but no sustained arrhythmias.  She does some household chores.  She does occasional walking for exercise. The patient denies any new symptoms such as chest discomfort, neck or arm discomfort. There has been no new shortness of breath, PND or orthopnea. There have been no reported palpitations, presyncope or syncope.  She was sent here because the EKG suggested left atrial enlargement.  Our EKG today was normal.   Past Medical History:  Diagnosis Date   Asthma    HLD (hyperlipidemia)    Hypertension    Pituitary tumor     Past Surgical History:  Procedure Laterality Date   BREAST BIOPSY Right 2016   Benign US    CESAREAN SECTION       Current Outpatient Medications  Medication Sig Dispense Refill   tiZANidine (ZANAFLEX) 2 MG tablet Take by mouth every 6 (six) hours as needed for muscle spasms.     amLODipine (NORVASC) 10 MG tablet Take 1 tablet (10 mg total) by mouth daily.     potassium chloride (K-DUR,KLOR-CON) 10 MEQ tablet Take 10 mEq by mouth 2 (two) times daily. (Patient not taking: Reported on 06/12/2023)     No current facility-administered medications for this visit.    Allergies:   Patient has no known allergies.    Social History:  The patient  reports that she has never smoked. She does not have any smokeless tobacco history on file. She  reports current alcohol use. She reports that she does not currently use drugs.   Family History:  The patient's family history includes Diabetes in her mother; Hypertension in her mother.    ROS:  Please see the history of present illness.   Otherwise, review of systems are positive for none.   All other systems are reviewed and negative.    PHYSICAL EXAM: VS:  BP (!) 144/68 (BP Location: Right Arm, Patient Position: Sitting, Cuff Size: Large)   Pulse 100   Ht 5\' 6"  (1.676 m)   Wt 170 lb 6.4 oz (77.3 kg)   SpO2 97%   BMI 27.50 kg/m  , BMI Body mass index is 27.5 kg/m. GENERAL:  Well appearing HEENT:  Pupils equal round and reactive, fundi not visualized, oral mucosa unremarkable NECK:  No jugular venous distention, waveform within normal limits, carotid upstroke brisk and symmetric, no bruits, no thyromegaly LYMPHATICS:  No cervical, inguinal adenopathy LUNGS:  Clear to auscultation bilaterally BACK:  No CVA tenderness CHEST:  Unremarkable HEART:  PMI not displaced or sustained,S1 and S2 within normal limits, no S3, no S4, no clicks, no rubs, no murmurs ABD:  Flat, positive bowel sounds normal in frequency in pitch, no bruits, no rebound, no guarding, no midline pulsatile mass, no hepatomegaly, no splenomegaly EXT:  2 plus pulses throughout, no edema, no cyanosis no clubbing SKIN:  No rashes no  nodules NEURO:  Cranial nerves II through XII grossly intact, motor grossly intact throughout PSYCH:  Cognitively intact, oriented to person place and time   EKG:  EKG Interpretation Date/Time:  Wednesday June 12 2023 09:52:20 EDT Ventricular Rate:  100 PR Interval:  152 QRS Duration:  74 QT Interval:  362 QTC Calculation: 466 R Axis:   38  Text Interpretation: Normal sinus rhythm Normal ECG No previous ECGs available Confirmed by Eilleen Grates ([id number]) on 06/12/2023 9:59:39 AM     Recent Labs: No results found for requested labs within last 365 days.    Lipid Panel No  results found for: "CHOL", "TRIG", "HDL", "CHOLHDL", "VLDL", "LDLCALC", "LDLDIRECT"    Wt Readings from Last 3 Encounters:  06/12/23 170 lb 6.4 oz (77.3 kg)  01/11/22 183 lb 9.6 oz (83.3 kg)  12/25/21 184 lb 1.6 oz (83.5 kg)      Other studies Reviewed: Additional studies/ records that were reviewed today include: Labs. Review of the above records demonstrates:  Please see elsewhere in the note.     ASSESSMENT AND PLAN:  Abnormal EKG: Her EKG today was normal.  She had a normal exam.  I am not suggesting a structurally abnormal heart.  No further workup.  Risk reduction: She does have risk factors including hypertension and dyslipidemia.  I like to screen her with a coronary calcium score  Hypertension: Her blood pressure is elevated and she is going to keep a blood pressure diary.  She will likely need a second agent but I like to see some readings first.  Dyslipidemia: LDL 133.  HDL 71.  I think she is going to need therapy for this but goals of therapy will be based on her calcium.  We did talk about a Mediterranean diet and other risk factor modification.  Palpitations: I do not think these are particularly symptomatic and she would be okay with conservative therapy and letting me know if they get worse.   Current medicines are reviewed at length with the patient today.  The patient does not have concerns regarding medicines.  The following changes have been made:  no change  Labs/ tests ordered today include:   Orders Placed This Encounter  Procedures   CT CARDIAC SCORING (SELF PAY ONLY)   EKG 12-Lead     Disposition:   FU with with me as needed based on the results of the above testing.   Signed, Eilleen Grates, MD  06/12/2023 10:52 AM    [facility]

## 2023-06-12 ENCOUNTER — Encounter: Payer: Self-pay | Admitting: Cardiology

## 2023-06-12 ENCOUNTER — Ambulatory Visit: Payer: Medicare Other | Attending: Cardiology | Admitting: Cardiology

## 2023-06-12 VITALS — BP 144/68 | HR 100 | Ht 66.0 in | Wt 170.4 lb

## 2023-06-12 DIAGNOSIS — R9431 Abnormal electrocardiogram [ECG] [EKG]: Secondary | ICD-10-CM | POA: Insufficient documentation

## 2023-06-12 DIAGNOSIS — E785 Hyperlipidemia, unspecified: Secondary | ICD-10-CM | POA: Insufficient documentation

## 2023-06-12 DIAGNOSIS — I10 Essential (primary) hypertension: Secondary | ICD-10-CM | POA: Diagnosis present

## 2023-06-12 MED ORDER — AMLODIPINE BESYLATE 10 MG PO TABS: 10.0000 mg | ORAL_TABLET | Freq: Every day | ORAL | Status: AC

## 2023-06-12 NOTE — Patient Instructions (Addendum)
 Testing/Procedures: Calcium score   CT scanning for a cardiac calcium score (CAT scanning), is a noninvasive, special x-ray that produces cross-sectional images of the body using x-rays and a computer. CT scans help physicians diagnose and treat medical conditions. For some CT exams, a contrast material is used to enhance visibility in the area of the body being studied. CT scans provide greater clarity and reveal more details than regular x-ray exams.   Follow-Up: At [facility], you and your health needs are our priority.  As part of our continuing mission to provide you with exceptional heart care, our providers are all part of one team.  This team includes your primary Cardiologist (physician) and Advanced Practice Providers or APPs (Physician Assistants and Nurse Practitioners) who all work together to provide you with the care you need, when you need it.  Your next appointment:   As needed   Provider:   Eilleen Grates, MD    We recommend signing up for the patient portal called "MyChart".  Sign up information is provided on this After Visit Summary.  MyChart is used to connect with patients for Virtual Visits (Telemedicine).  Patients are able to view lab/test results, encounter notes, upcoming appointments, etc.  Non-urgent messages can be sent to your provider as well.   To learn more about what you can do with MyChart, go to [email or web address].   Other Instructions Monitor blood pressure readings and log. Please send blood pressures via MyChart for review.      Blood Pressure Record Sheet To take your blood pressure, you will need a blood pressure machine. You can buy a blood pressure machine (blood pressure monitor) at your clinic, drug store, or online. When choosing one, consider: An automatic monitor that has an arm cuff. A cuff that wraps snugly around your upper arm. You should be able to fit only one finger between your arm and the cuff. A device that stores  blood pressure reading results. Do not choose a monitor that measures your blood pressure from your wrist or finger. Follow your health care provider's instructions for how to take your blood pressure. To use this form: Take your blood pressure medications every day These measurements should be taken when you have been at rest for at least 10-15 min Take at least 2 readings with each blood pressure check. This makes sure the results are correct. Wait 1-2 minutes between measurements. Write down the results in the spaces on this form. Keep in mind it should always be recorded systolic over diastolic. Both numbers are important.  Repeat this every day for 2-3 weeks, or as told by your health care provider.  Make a follow-up appointment with your health care provider to discuss the results.  Blood Pressure Log Date Medications taken? (Y/N) Blood Pressure Time of Day

## 2023-06-24 ENCOUNTER — Encounter: Payer: Self-pay | Admitting: *Deleted

## 2023-06-26 ENCOUNTER — Ambulatory Visit: Payer: Self-pay | Admitting: *Deleted

## 2023-07-05 ENCOUNTER — Ambulatory Visit (HOSPITAL_COMMUNITY)
Admission: RE | Admit: 2023-07-05 | Discharge: 2023-07-05 | Disposition: A | Discharge status: Home / Self Care | Payer: Self-pay | Source: Ambulatory Visit | Attending: Cardiology | Admitting: Cardiology

## 2023-07-05 DIAGNOSIS — I10 Essential (primary) hypertension: Secondary | ICD-10-CM | POA: Insufficient documentation

## 2023-07-05 DIAGNOSIS — E785 Hyperlipidemia, unspecified: Secondary | ICD-10-CM | POA: Insufficient documentation

## 2023-07-05 DIAGNOSIS — R9431 Abnormal electrocardiogram [ECG] [EKG]: Secondary | ICD-10-CM | POA: Insufficient documentation

## 2023-07-08 ENCOUNTER — Ambulatory Visit: Payer: Self-pay | Admitting: Cardiology

## 2023-09-13 ENCOUNTER — Other Ambulatory Visit: Payer: Self-pay | Admitting: Physician Assistant

## 2023-09-13 DIAGNOSIS — Z1231 Encounter for screening mammogram for malignant neoplasm of breast: Secondary | ICD-10-CM
